# Patient Record
Sex: Female | Born: 1960 | Race: Black or African American | Hispanic: No | Marital: Married
Health system: Southern US, Community
[De-identification: ages and names within clinical notes are randomized; demographics above are authoritative.]

---

## 2003-08-30 ENCOUNTER — Other Ambulatory Visit: Payer: Self-pay

## 2003-11-23 ENCOUNTER — Other Ambulatory Visit: Payer: Self-pay

## 2004-01-04 ENCOUNTER — Other Ambulatory Visit: Payer: Self-pay

## 2004-01-04 ENCOUNTER — Emergency Department: Payer: Self-pay | Admitting: Emergency Medicine

## 2004-03-11 ENCOUNTER — Emergency Department (HOSPITAL_COMMUNITY): Admission: EM | Admit: 2004-03-11 | Discharge: 2004-03-11 | Payer: Self-pay | Admitting: Emergency Medicine

## 2004-07-02 ENCOUNTER — Emergency Department: Payer: Self-pay | Admitting: Emergency Medicine

## 2004-09-05 ENCOUNTER — Emergency Department: Payer: Self-pay | Admitting: Emergency Medicine

## 2004-09-05 ENCOUNTER — Other Ambulatory Visit: Payer: Self-pay

## 2004-11-08 ENCOUNTER — Other Ambulatory Visit: Payer: Self-pay

## 2004-11-08 ENCOUNTER — Emergency Department: Payer: Self-pay | Admitting: Emergency Medicine

## 2005-06-04 ENCOUNTER — Other Ambulatory Visit: Payer: Self-pay

## 2005-06-04 ENCOUNTER — Emergency Department: Payer: Self-pay | Admitting: Emergency Medicine

## 2005-08-21 ENCOUNTER — Other Ambulatory Visit: Payer: Self-pay

## 2005-08-21 ENCOUNTER — Emergency Department: Payer: Self-pay | Admitting: Unknown Physician Specialty

## 2005-10-27 ENCOUNTER — Emergency Department: Payer: Self-pay

## 2006-01-07 ENCOUNTER — Other Ambulatory Visit: Payer: Self-pay

## 2006-01-07 ENCOUNTER — Emergency Department: Payer: Self-pay | Admitting: Emergency Medicine

## 2006-02-01 ENCOUNTER — Ambulatory Visit: Payer: Self-pay | Admitting: Internal Medicine

## 2006-02-03 ENCOUNTER — Emergency Department: Payer: Self-pay | Admitting: Emergency Medicine

## 2006-02-03 ENCOUNTER — Other Ambulatory Visit: Payer: Self-pay

## 2007-09-18 ENCOUNTER — Other Ambulatory Visit: Payer: Self-pay

## 2007-09-18 ENCOUNTER — Emergency Department: Payer: Self-pay | Admitting: Emergency Medicine

## 2007-11-19 ENCOUNTER — Emergency Department: Payer: Self-pay | Admitting: Emergency Medicine

## 2007-11-20 ENCOUNTER — Other Ambulatory Visit: Payer: Self-pay

## 2007-11-21 ENCOUNTER — Other Ambulatory Visit: Payer: Self-pay

## 2007-11-21 ENCOUNTER — Emergency Department: Payer: Self-pay | Admitting: Emergency Medicine

## 2008-01-01 ENCOUNTER — Emergency Department: Payer: Self-pay | Admitting: Emergency Medicine

## 2008-03-14 ENCOUNTER — Emergency Department: Payer: Self-pay | Admitting: Emergency Medicine

## 2008-07-14 ENCOUNTER — Emergency Department: Payer: Self-pay | Admitting: Emergency Medicine

## 2008-08-30 ENCOUNTER — Emergency Department: Payer: Self-pay | Admitting: Emergency Medicine

## 2008-09-30 ENCOUNTER — Emergency Department: Payer: Self-pay | Admitting: Unknown Physician Specialty

## 2008-10-13 ENCOUNTER — Emergency Department: Payer: Self-pay | Admitting: Emergency Medicine

## 2008-12-16 ENCOUNTER — Emergency Department: Payer: Self-pay | Admitting: Emergency Medicine

## 2009-11-19 ENCOUNTER — Emergency Department: Payer: Self-pay | Admitting: Emergency Medicine

## 2010-08-14 ENCOUNTER — Emergency Department: Payer: Self-pay

## 2011-01-16 ENCOUNTER — Inpatient Hospital Stay: Payer: Self-pay | Admitting: Psychiatry

## 2011-02-03 ENCOUNTER — Observation Stay: Payer: Self-pay | Admitting: Internal Medicine

## 2011-04-08 IMAGING — CR DG CHEST 2V
1 series · 2 of 2 positions shown · non-contrast
Comparison: none

REASON FOR EXAM: chest pain left upper with shortness of breath
COMMENTS:   LMP: Post Hysterectomy

PROCEDURE:     DXR - DXR CHEST PA (OR AP) AND LATERAL  - August 31, 2008  [DATE]
RESULT:     Comparison is made to the prior exam of 11/21/2007. The lung
fields are clear. The heart, mediastinal and osseous structures reveal no
significant abnormalities.

[Series 1: view not recorded · 0.17mm/px · 2 of 2 slices shown]
[im 1/2]
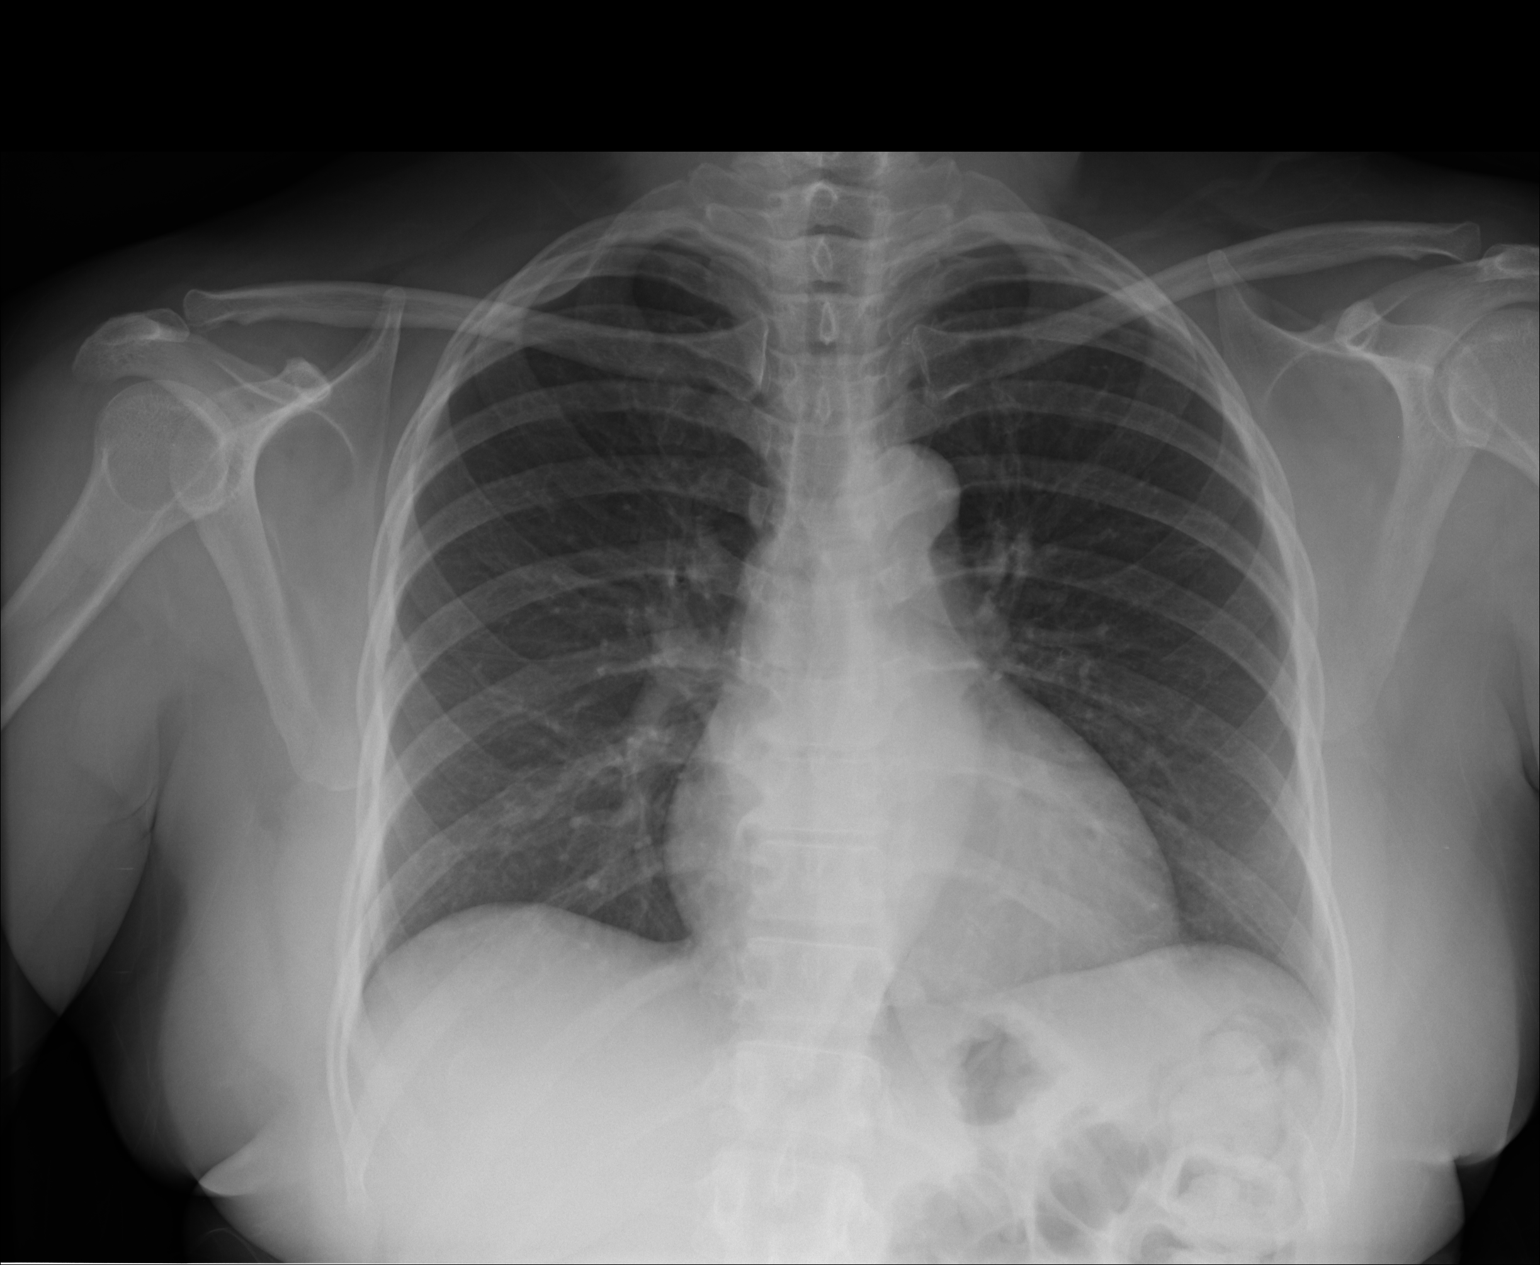
[im 2/2]
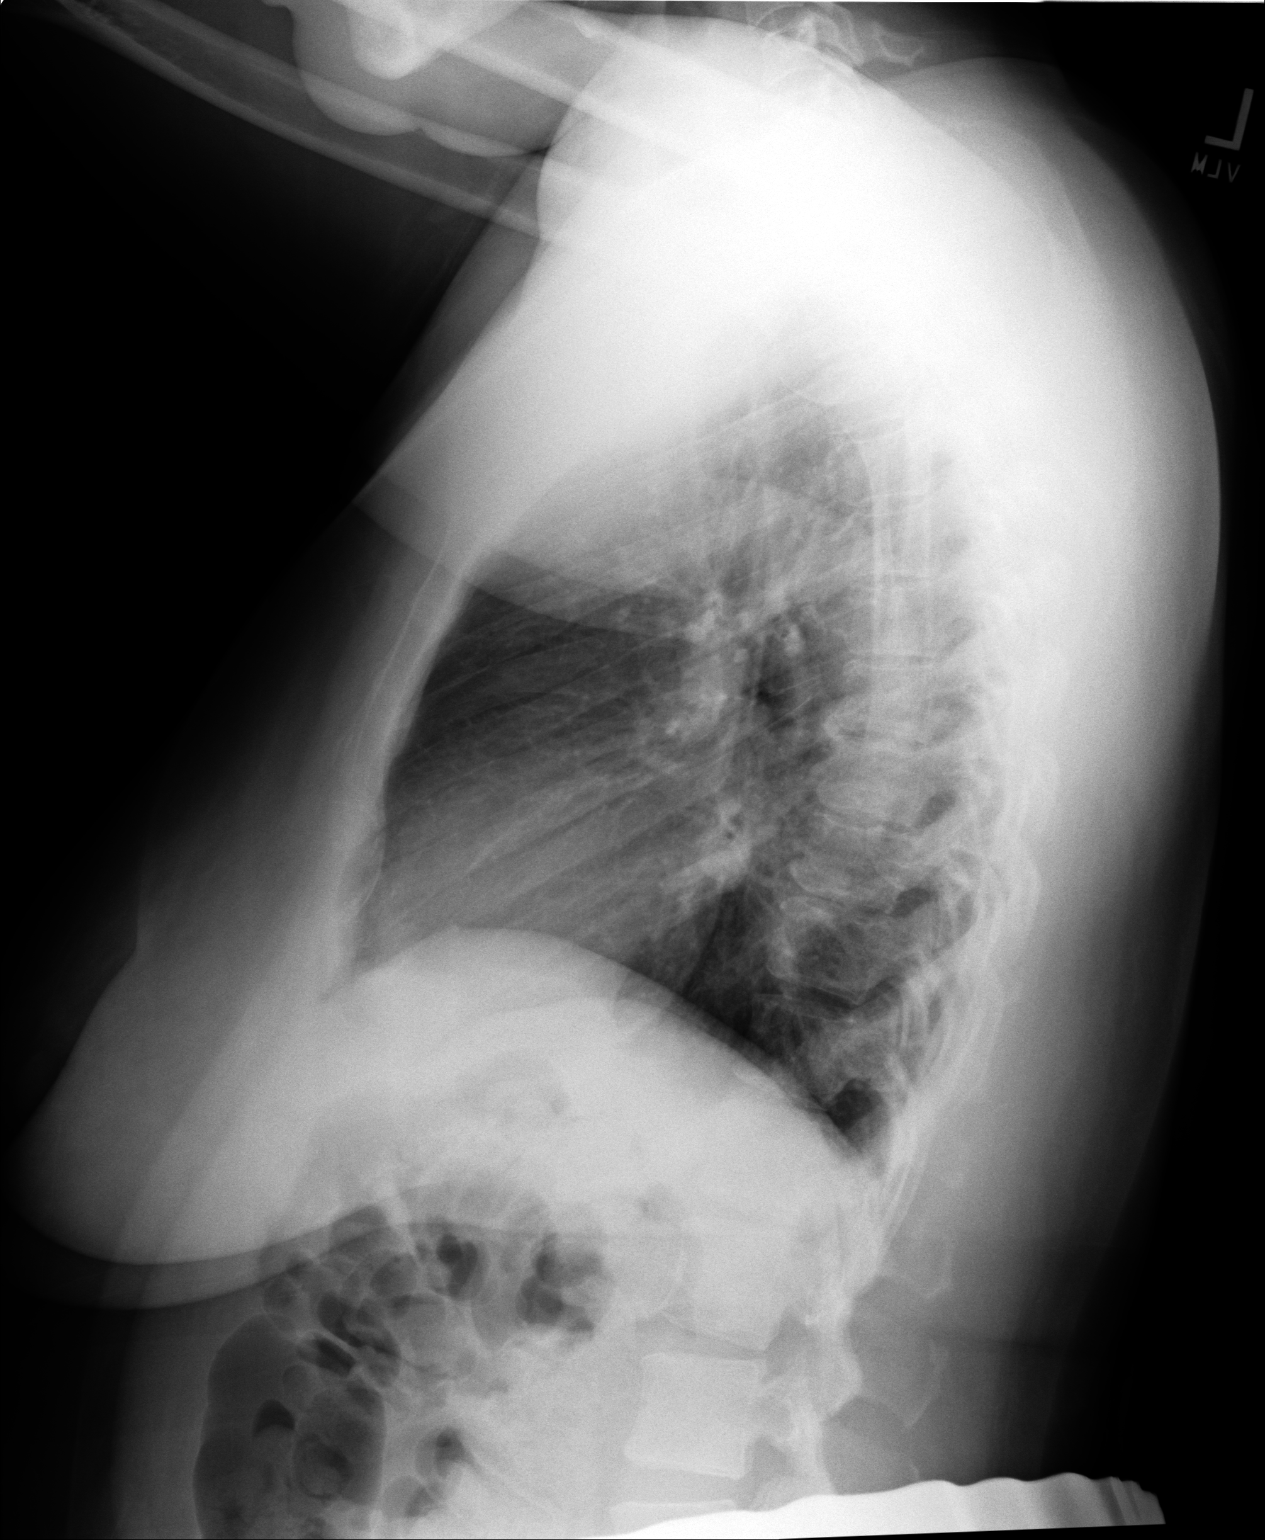

[2 of 2 positions shown; findings below may reference images not displayed]

IMPRESSION: 1.     No significant abnormalities are noted.

## 2011-05-17 ENCOUNTER — Inpatient Hospital Stay: Payer: Self-pay | Admitting: Psychiatry

## 2011-05-17 LAB — COMPREHENSIVE METABOLIC PANEL
Albumin: 3.7 g/dL (ref 3.4–5.0)
Alkaline Phosphatase: 44 U/L — ABNORMAL LOW (ref 50–136)
Bilirubin,Total: 0.2 mg/dL (ref 0.2–1.0)
Creatinine: 0.84 mg/dL (ref 0.60–1.30)
Osmolality: 282 (ref 275–301)
Potassium: 3.9 mmol/L (ref 3.5–5.1)
Sodium: 142 mmol/L (ref 136–145)
Total Protein: 8.3 g/dL — ABNORMAL HIGH (ref 6.4–8.2)

## 2011-05-17 LAB — CBC
HGB: 11.6 g/dL — ABNORMAL LOW (ref 12.0–16.0)
MCV: 71 fL — ABNORMAL LOW (ref 80–100)
Platelet: 264 10*3/uL (ref 150–440)
RBC: 5.29 10*6/uL — ABNORMAL HIGH (ref 3.80–5.20)
WBC: 6.5 10*3/uL (ref 3.6–11.0)

## 2011-05-17 LAB — DRUG SCREEN, URINE
Barbiturates, Ur Screen: NEGATIVE (ref ?–200)
Cannabinoid 50 Ng, Ur ~~LOC~~: POSITIVE (ref ?–50)
Cocaine Metabolite,Ur ~~LOC~~: NEGATIVE (ref ?–300)
MDMA (Ecstasy)Ur Screen: POSITIVE (ref ?–500)
Opiate, Ur Screen: NEGATIVE (ref ?–300)
Phencyclidine (PCP) Ur S: NEGATIVE (ref ?–25)

## 2011-05-17 LAB — ACETAMINOPHEN LEVEL: Acetaminophen: <2 ug/mL

## 2011-06-04 LAB — LIPID PANEL: HDL Cholesterol: 62 mg/dL — ABNORMAL HIGH (ref 40–60)

## 2012-07-29 ENCOUNTER — Ambulatory Visit: Payer: Self-pay | Admitting: Family Medicine

## 2013-03-21 IMAGING — CR DG CHEST 2V
1 series · 2 of 2 positions shown · non-contrast
Comparison: none

REASON FOR EXAM: cough
COMMENTS:

[Series 1: view not recorded · 0.17mm/px · 2 of 2 slices shown]
[im 1/2]
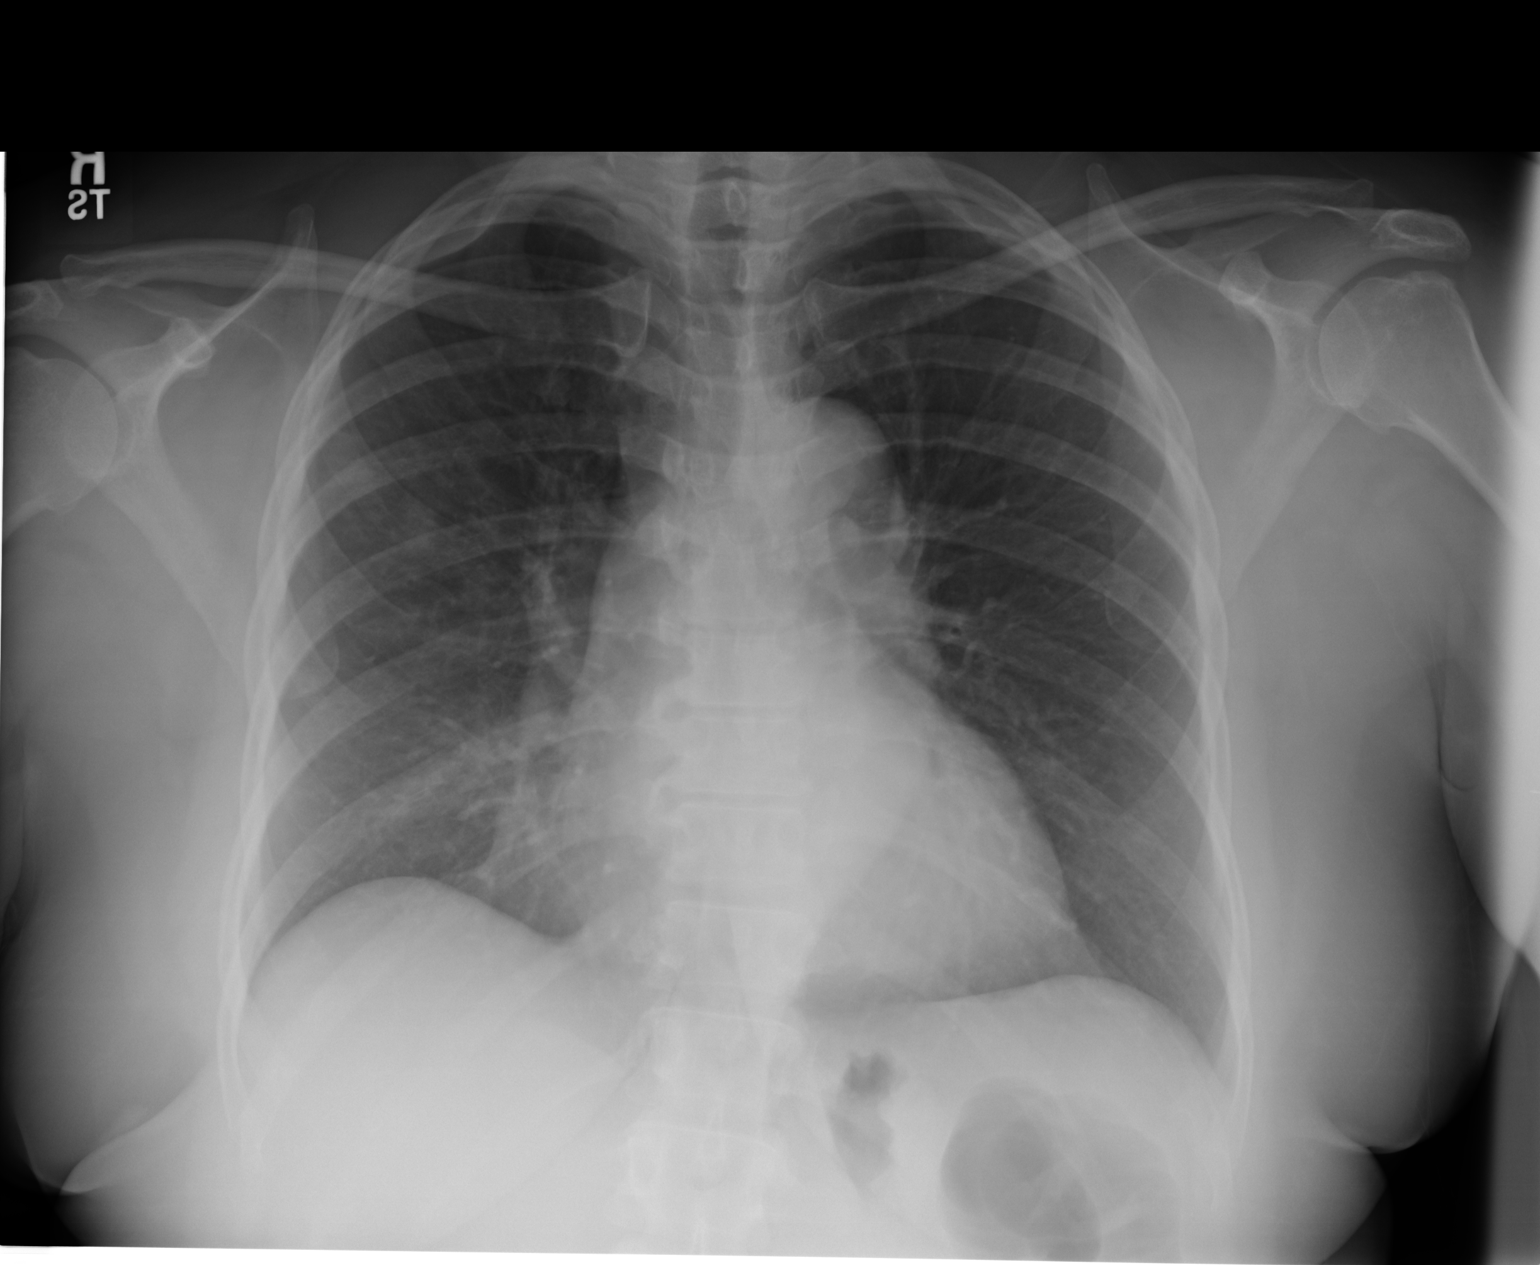
[im 2/2]
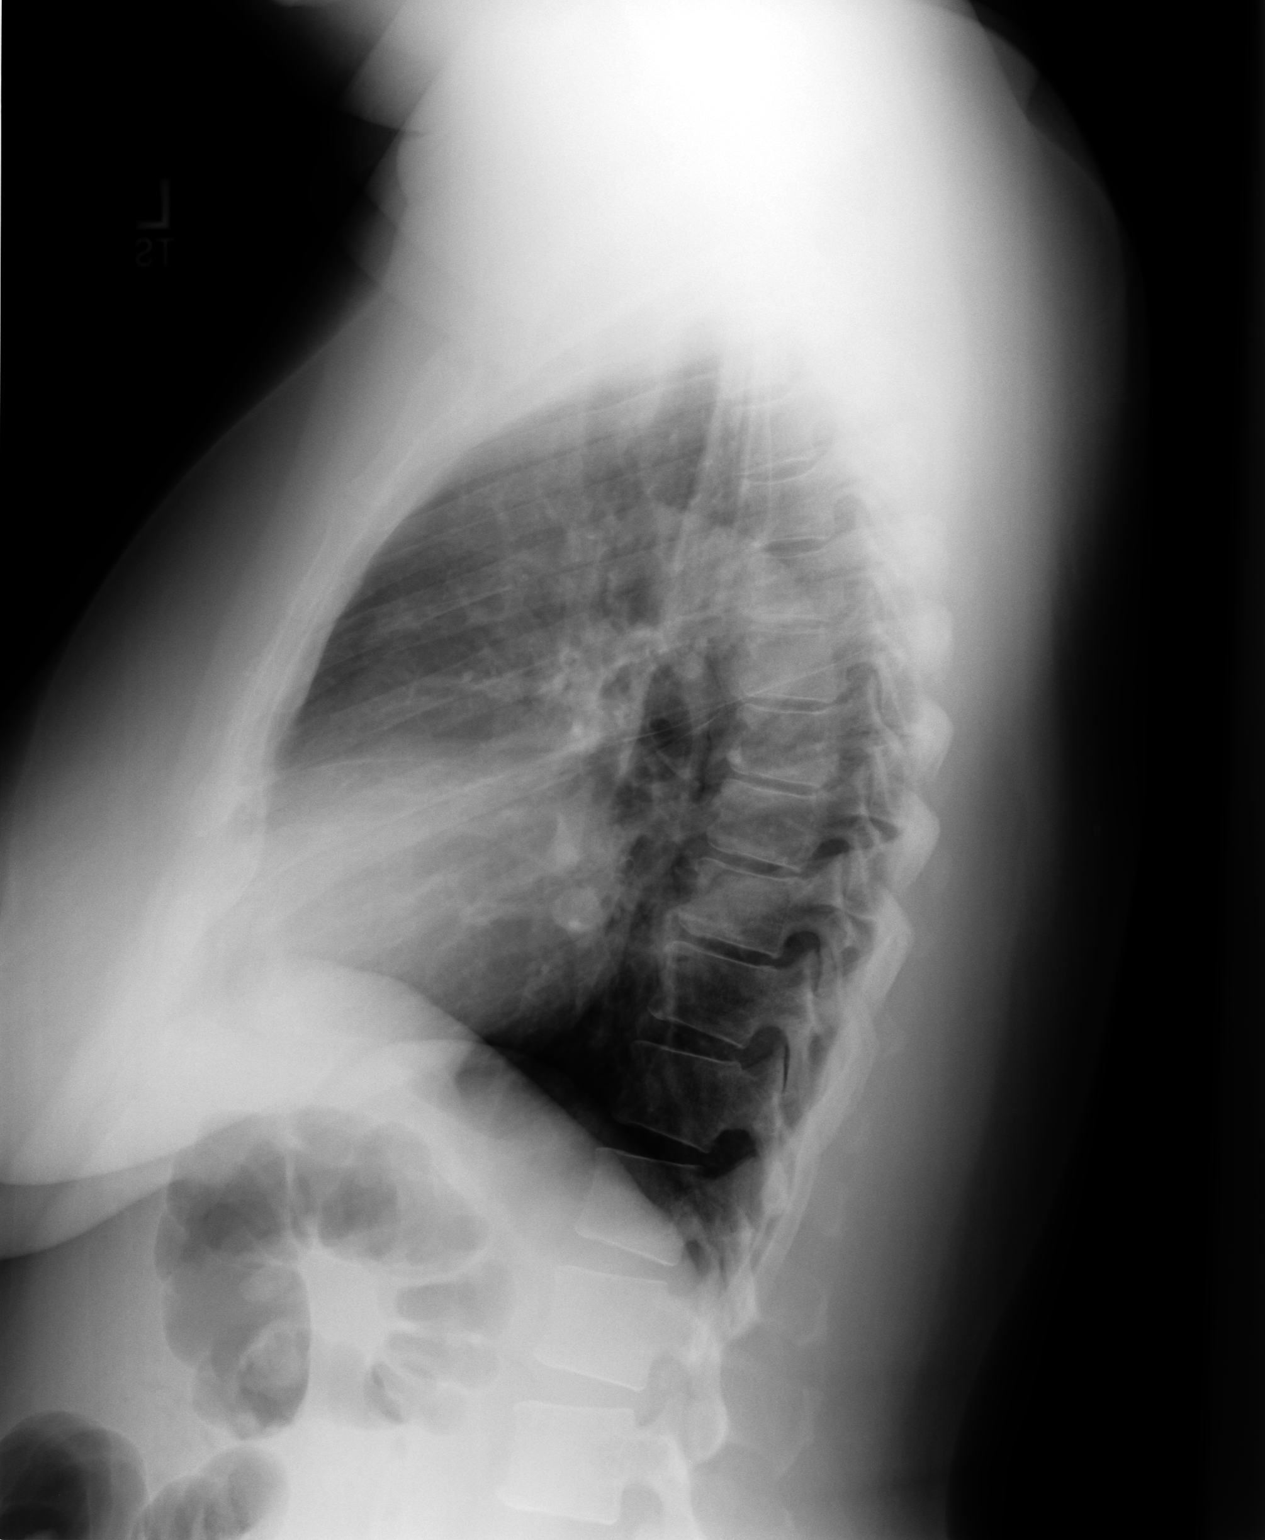

[2 of 2 positions shown; findings below may reference images not displayed]

PROCEDURE:     DXR - DXR CHEST PA (OR AP) AND LATERAL  - August 14, 2010  [DATE]

RESULT:

Comparison is made to a prior exam of 12/16/2008.

The current exam shows two faint areas of increased density projected over
the third, right anterior rib. The findings are likely secondary to the
confluence of vascular and osseous markings. Infiltrate is considered
unlikely but if symptomatology persists, follow-up examination is
recommended. The lung fields otherwise are clear. The heart, mediastinal and
osseous structures show no significant abnormalities.
IMPRESSION: 1.  There are minimal areas of increased density projected over the third,
right anterior rib and likely secondary to a confluence of vascular and
osseous markings. Nonetheless, if symptomatology persists, follow-up
examination is recommended to exclude progressive change in this region.
2.  The lung fields otherwise are clear.
3.  The heart size is normal.

## 2013-09-09 IMAGING — CT CT HEAD WITHOUT CONTRAST
2 series · 16 of 30 positions shown, 20 images · non-contrast
Comparison: none

REASON FOR EXAM: headache
COMMENTS:   May transport without cardiac monitor

PROCEDURE:     CT  - CT HEAD WITHOUT CONTRAST  - February 02, 2011  [DATE]
RESULT:     History: Headache.
Comparison Study: Head CT of 08/14/2010.

[Series 2: without · axial · non-contrast · 0.39mm/px · z∈[+392,+512]mm · 13 of 30 slices shown, 17 images]
[im 3/30  brain]
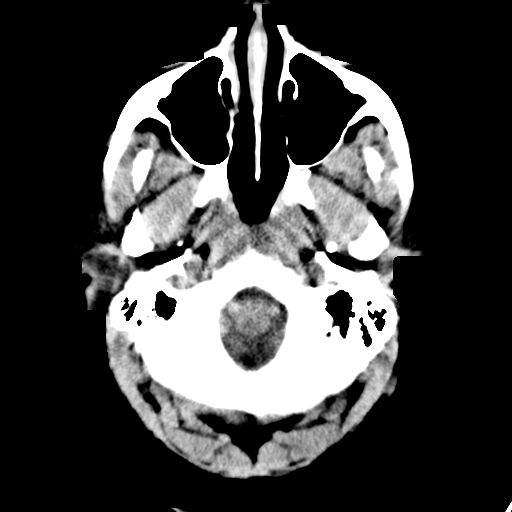
[im 3/30  bone]
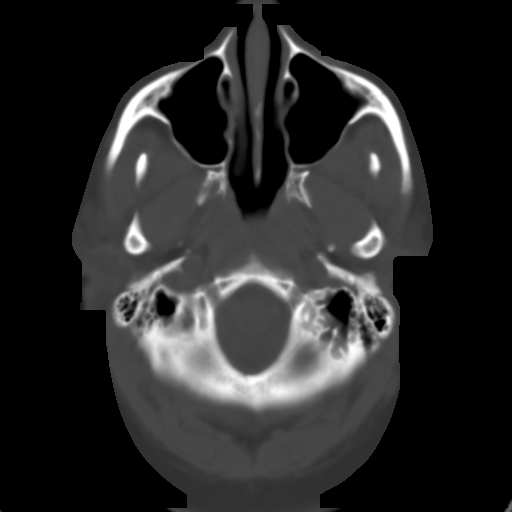
[im 5/30  brain]
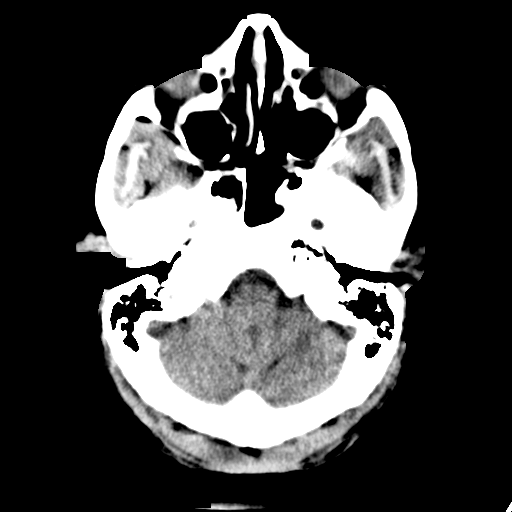
[im 7/30  brain]
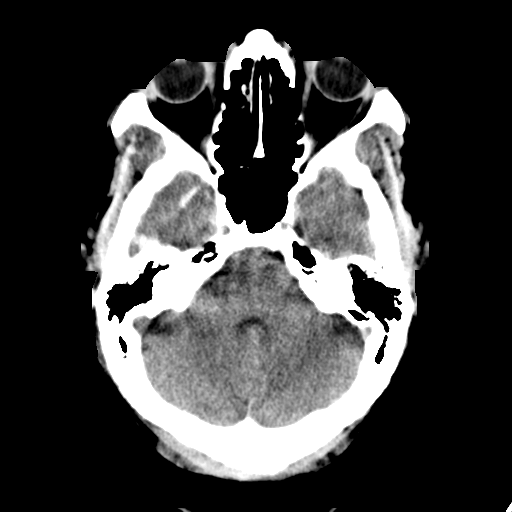
[im 9/30  brain]
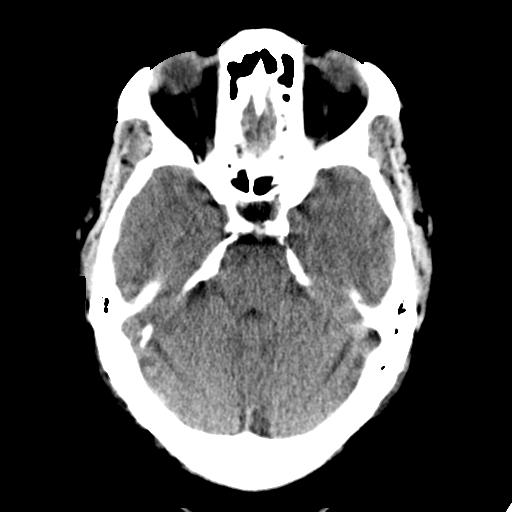
[im 11/30  brain]
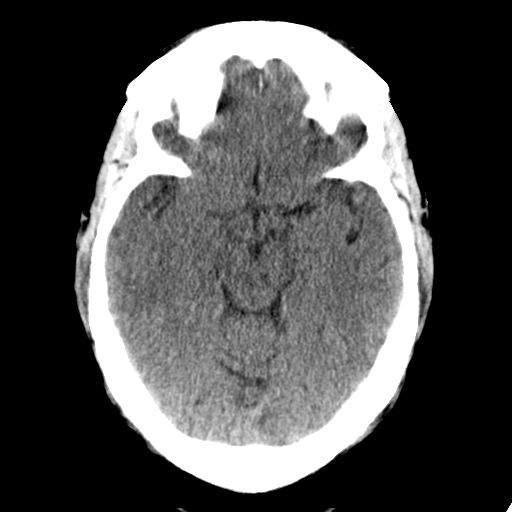
[im 11/30  bone]
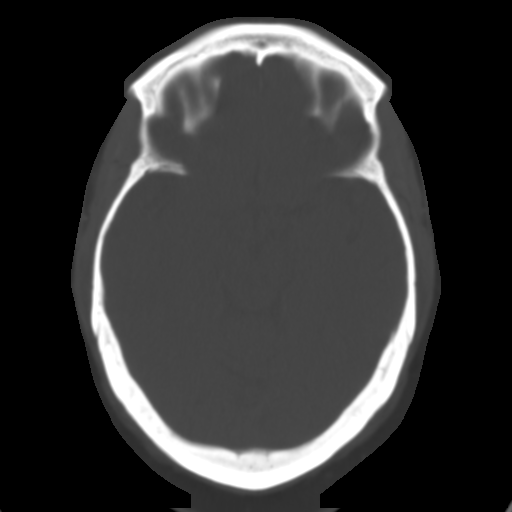
[im 13/30  brain]
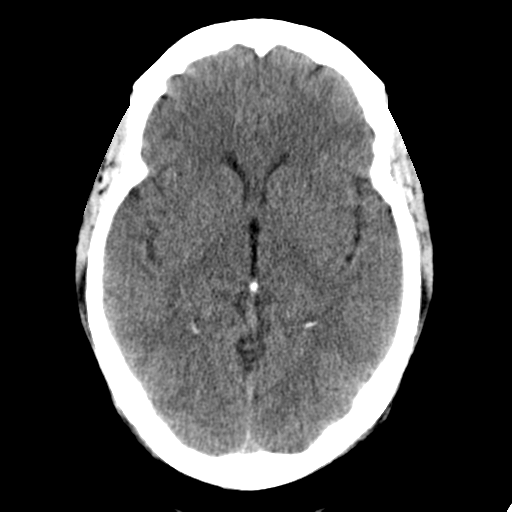
[im 15/30  brain]
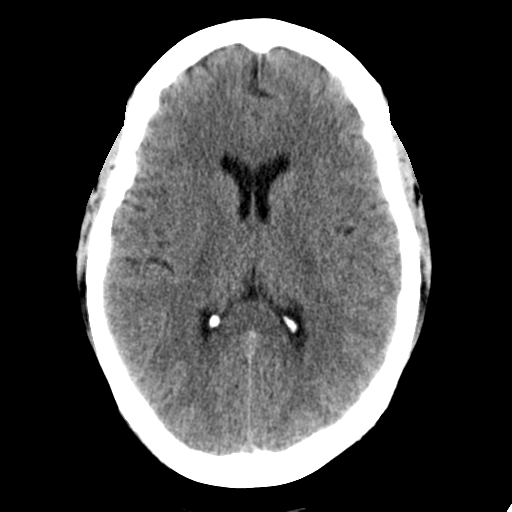
[im 17/30  brain]
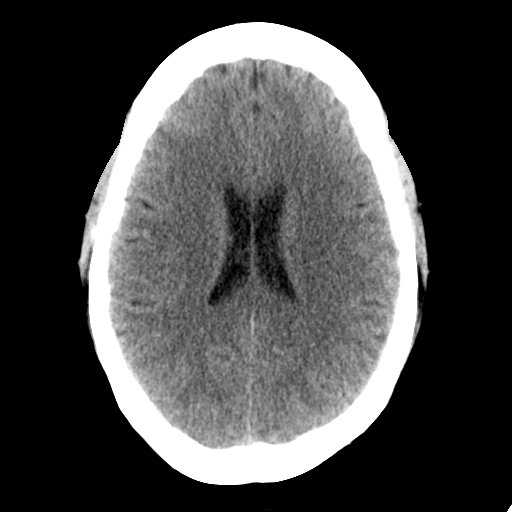
[im 19/30  brain]
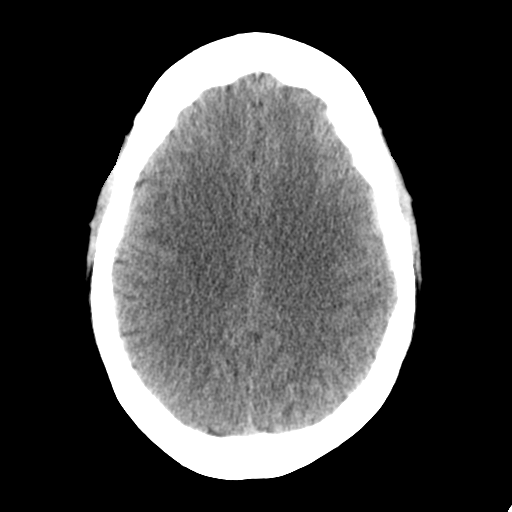
[im 19/30  bone]
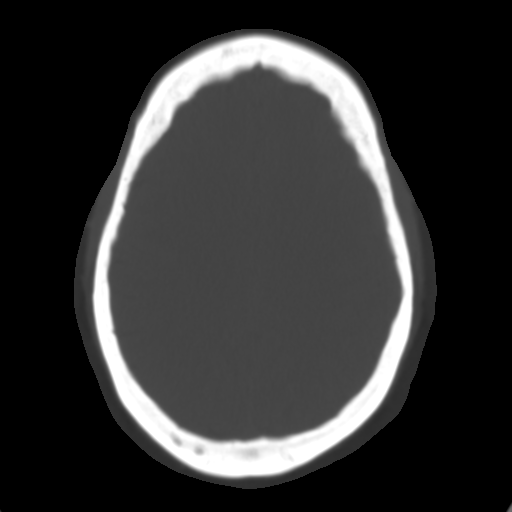
[im 21/30  brain]
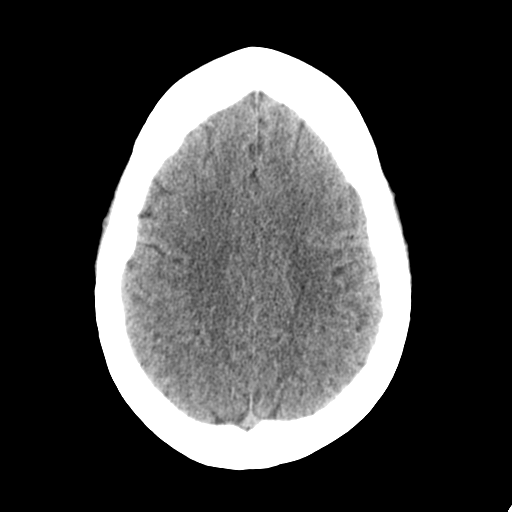
[im 23/30  brain]
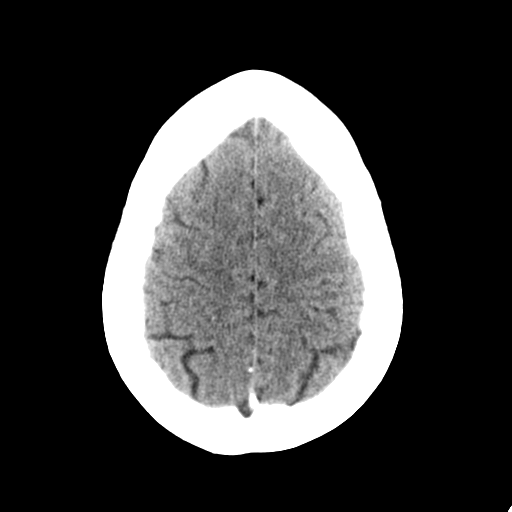
[im 25/30  brain]
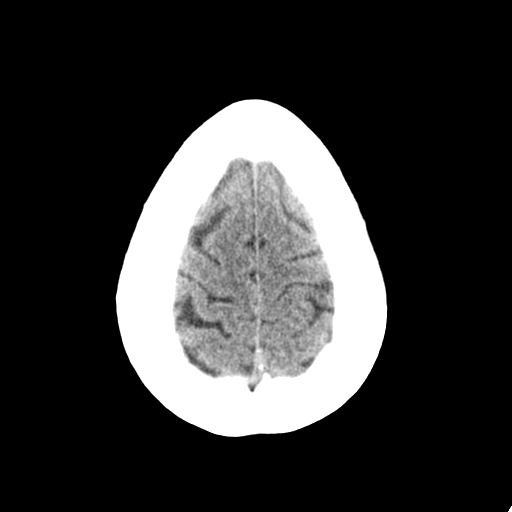
[im 27/30  brain]
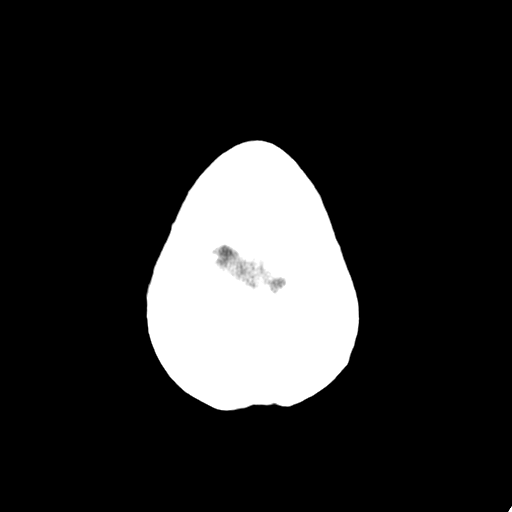
[im 27/30  bone]
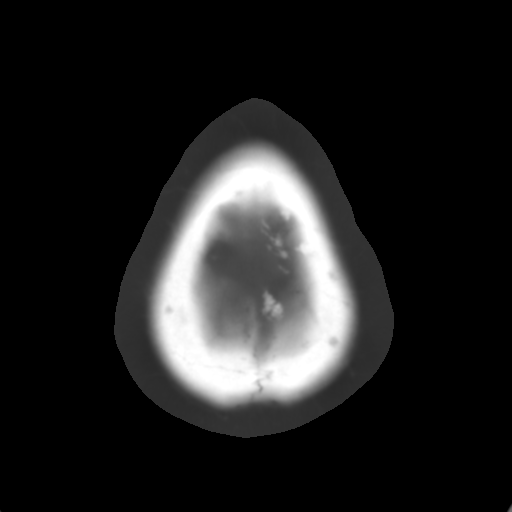

[Series 3: bone · axial · 0.39mm/px · z∈[+392,+432]mm · 3 of 30 slices shown]
[im 3/30  bone]
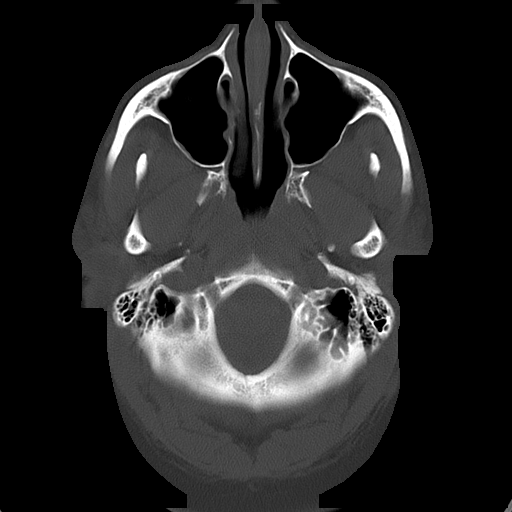
[im 7/30  bone]
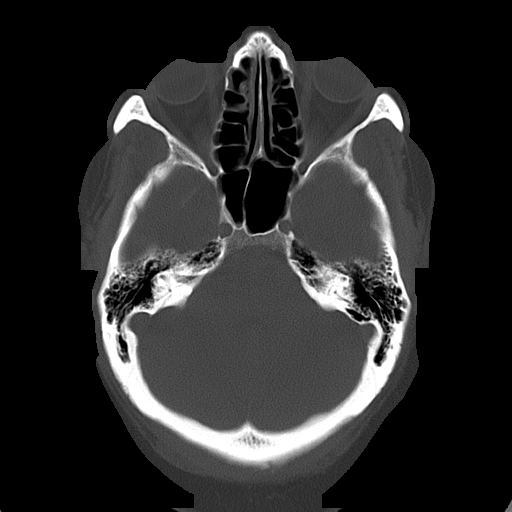
[im 11/30  bone]
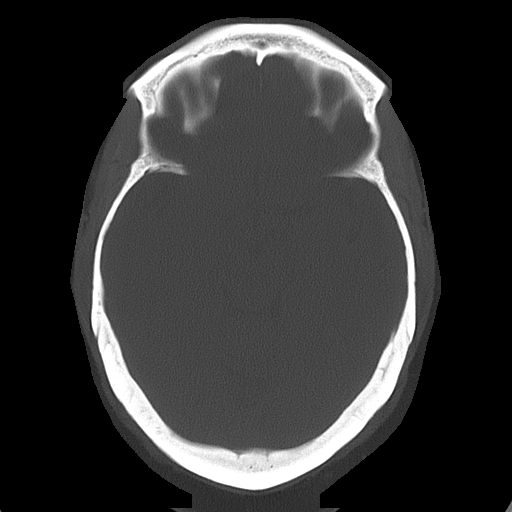

[16 of 30 positions shown; findings below may reference images not displayed]

FINDINGS: No mass. No hydrocephalus. No hemorrhage. No acute bony
abnormality.
IMPRESSION: No acute abnormality.

## 2014-04-21 ENCOUNTER — Ambulatory Visit: Payer: Self-pay

## 2014-06-20 NOTE — H&P (Signed)
PATIENT NAME:  Leah Cortez, Leah A MR#:  [id number] DATE OF BIRTH:  [birth year]  DATE OF ADMISSION:  05/17/2011  IDENTIFYING INFORMATION AND CHIEF COMPLAINT: This is a 54 year old woman with a past history of depression and psychotic symptoms who came to the Emergency Room.   CHIEF COMPLAINT: "I can't stand the voices."   HISTORY OF PRESENT ILLNESS: The patient states that she is having auditory hallucinations. She hears them most of the day. They often tell her that she should try to kill herself. They have been getting worse for weeks. Her mood stays depressed. She stays very tired, fatigued and withdrawn. She feels hopeless. She does not sleep well at night. Feels nonfunctional and like she cannot do anything during the day. Stays nervous a lot. She says that she has been compliant with her prescribed psychiatric medicine. She does not report any specific new stress or change in her life that would necessarily worsen psychiatric symptoms. She uses marijuana intermittently but not every day. She denies any other drug abuse. She has been compliant with her psychiatric medicine.   PAST PSYCHIATRIC HISTORY: The patient has had previous psychiatric hospitalizations most notably here about four months ago. She had similar symptoms at that time. Diagnosis was major depression with psychotic features. She improved on treatment with antidepressants and antipsychotics over the course of about two weeks. She tells me that after she got out of the hospital she did well for a little while, but then started to slide downhill even though she claims she has continued to be compliant with all of her medicines. She had been treated for similar kind of symptoms in the hospital a couple of years ago. This all seems to have been going on for several years. She says that she has been depressed for a long time, cannot quite remember how long. She does remember that she felt okay when she first got out of high school, but says that  it has been many years that she has been feeling this bad. She does not describe any past manic episodes that I can tell. She denies that she has actually tried to kill herself. She denies any history of violence.   PAST MEDICAL HISTORY: The patient has high blood pressure which has been resistant to o medication. She is currently on several medicines for blood pressure and still tends to run a high blood pressure. No other ongoing medical problems.   SOCIAL HISTORY: She does not get disability, is not able to work. She lives with a friend and also with her own 52 year old daughter. She relies on her 52 year old daughter to help her out a lot. She is not married. She does not do much outside the home. She does not stay active very much. 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   SUBSTANCE ABUSE HISTORY: She says that she uses marijuana intermittently but substance abuse does not seem to have ever been a major problem. She did not drink alcohol recently.   CURRENT MEDICATIONS:  1. Cymbalta 30 mg twice a day. 2. Trazodone 200 mg at night. 3. Geodon 60 mg twice a day. 4. Lisinopril 20 mg a day. 5. Clonidine 0.2 mg two or three times a day. 6. Amlodipine 10 mg a day.   ALLERGIES: Sulfa drugs and amoxicillin.   REVIEW OF SYSTEMS: Reports mood is depressed. Feels fatigued. Denies sweats or fever. Denies nausea. She has a low appetite  and has lost weight. She denies diarrhea or constipation. Poor sleep at night. Auditory hallucinations positive. Suicidal ideation has been positive. Homicidal ideation is negative.   MENTAL STATUS EXAMINATION: Carollee Sires in the Emergency Room. Cooperative but very slow. Very poor eye contact, barely looked at me even though I was sitting only a couple of feet away from her. Psychomotor activity very low and depressed. Speech very quiet often difficult to understand, also slow. Thought process slow and shows poverty  of thought. Endorses auditory hallucinations. She does not look like she is responding to internal stimuli. She denies visual hallucinations. Endorses recent suicidal ideation and feelings of hopelessness. She denies homicidal ideation. Judgment and insight impaired.   PHYSICAL EXAMINATION:   GENERAL: The patient is an overweight woman, although I do note that she weighs less than she has on previous admissions. She does not appear to be in any obvious physical distress.   CURRENT VITAL SIGNS: Temperature 97, pulse 77, respirations 18, blood pressure 154/94.   SKIN: No skin lesions identified.   HEENT: Pupils equal and reactive. Face symmetric.   NEUROLOGICAL: Cranial nerves all intact. Strength and reflex is normal throughout. Gait within normal limits.   LUNGS: Clear to auscultation.   HEART: Regular rate and rhythm.   ABDOMEN: Soft and nontender. Normal strength, reflexes, and sensation.   RESULTS OF LAB TESTS: Lab tests done in the Emergency Room show a thyroid stimulating hormone that is normal. She does have a low hemoglobin at 11.6 with some low indices, low MCV at 71. White blood cell count is normal at 6.5. Alcohol undetectable. Chemistry panel shows slightly low alkaline phosphatase; I doubt that is of clear significance. Drug screen positive for marijuana and also for MDMA.   ASSESSMENT: A 54 year old woman with a history of depression and psychosis reporting suicidal ideation, auditory hallucinations, and inability to take care of herself. She says she has been compliant with her medicines. She says symptoms have been progressively getting worse for quite some time. She does have a drug screen positive for cannabis and MDMA, but she denies that she has been heavily abusing drugs. She has a past history of similar symptoms that have responded to medication and supportive therapy in the past. She needs hospitalization because of acute dangerousness.   TREATMENT PLAN: Admit to the  psychiatric ward. Continue on all current medications. Monitor vital signs. Engage the patient in groups and activities. Supportive therapy and cognitive therapy daily. Monitor progress in resolving auditory hallucinations as well as suicidal ideation. Consider alternative treatments if necessary.   DIAGNOSIS PRINCIPLE AND PRIMARY:   AXIS I: Major depressive episode, recurrent, with psychotic features.   SECONDARY DIAGNOSES:   AXIS I: Cannabis use, rule out abuse.   AXIS II: Deferred.   AXIS III: Hypertension, seasonal allergies.   AXIS IV: Severe due to stress from lack of resources.   AXIS V: Functioning at time of admission 20.  ____________________________ Audery Amel, MD jtc:slb D: 05/17/2011 17:10:45 ET T: 05/17/2011 17:45:55 ET JOB#: [id number]  cc: Audery Amel, MD, <Dictator> Audery Amel MD ELECTRONICALLY SIGNED 05/18/2011 17:51

## 2014-06-20 NOTE — Discharge Summary (Signed)
PATIENT NAME:  Leah Cortez, Leah Cortez MR#:  [id number] DATE OF BIRTH:  06-03-1960  DATE OF ADMISSION:  05/17/2011 DATE OF DISCHARGE:  06/04/2011  HOSPITAL COURSE: The patient was admitted to the hospital with psychotic symptoms, particularly auditory hallucinations and also suicidal ideation with severe depression. She has a history of recurrent episodes of this type, not clear if it is better diagnosed as recurrent severe major depression with psychosis or as schizoaffective disorder. In the hospital she remained quite sick for much of her hospital stay. She endorsed auditory hallucinations. She did not engage in any dangerous or aggressive behavior, did not make any suicide attempts but for several days still had some suicidal ideation. Gradually with medication her symptoms improved. She still remained somewhat withdrawn with a blank affect much of the time and looked kind of depressed. I added Abilify 5 mg to her medicine as a booster treatment for depression understanding this is making two atypical antipsychotics. This can be looked at as failure of monotherapy to fully control her symptoms despite several antipsychotics in the past or potentially as a cross taper as the outpatient physician may prefer. Once started on the Abilify, she seemed to have improved even more whether it's cause or effect or not. Her mood has improved. She's smiling more. She is more interactive. She takes better care of her hygiene. She now denies any suicidal ideation, says she is not hearing voices at all. Smiles more. Has decided to go to live with her sister at the time of discharge because it is a less stressful environment. Shows good insight about her illness and agrees to follow-up treatment in the community. The patient has been counseled about use of marijuana and its negative impact on mental illness and strongly encouraged to discontinue it. She agrees to this.  LABORATORY DATA: On admission TSH normal at 1.99. Alcohol  undetectable. Total protein elevated at 8.3, alkaline phosphatase slightly low at 44. No other chemistry abnormalities. Drug screen positive for cannabis and MDMA. Hematology panel showed slightly low hemoglobin at 11.6, slightly low MCV at 71. A lipid panel checked prior to discharge shows cholesterol 188, triglycerides 67, HDL 62 which is elevated, VLDL 13, and LDL 113 which is just a little bit elevated.   DISCHARGE MEDICATIONS:  1. Amlodipine 10 mg per day.  2. Zyrtec 10 mg per day.  3. Clonidine patch TTS-2 once per week.  4. Lisinopril 20 mg per day.  5. Cymbalta 90 mg per day.  6. MiraLAX 17 grams per day.  7. Ambien 10 mg at night.  8. Trandate (labetalol) 100 mg per day.  9. Docusate 200 mg twice a day.  10. Risperdal 5 mg twice a day.  11. Abilify 5 mg per day.   MENTAL STATUS EXAMINATION: Neatly dressed and groomed woman. Good eye contact. Speech is decreased in total amount but easy to understand and better than on admission for sure. Psychomotor activity still a little bit slowed. No EPS to direct examination. Affect a little more reactive, smiling more, a little more upbeat. Mood stated as good. Thoughts generally lucid although slow still. Denies delusions. Denies hallucinations. Denies suicidal or homicidal ideation. Shows some improved judgment and insight.   DISPOSITION:  1. Discharge back to her sister's house.  2. Follow-up with psychiatric treatment in the community as previously.   DIAGNOSES PRINCIPLE AND PRIMARY:  AXIS I: Major depression, severe, recurrent, with psychotic features.   SECONDARY DIAGNOSES:  AXIS I: Rule out schizoaffective disorder.   AXIS  II: No diagnosis.   AXIS III:  1. Hypertension. 2. Constipation.   AXIS IV: Moderate to severe from chronic burden of illness and chaotic social situation.   AXIS V: Functioning at time of discharge 55.   ____________________________ Audery AmelJohn T. Clapacs, MD jtc:drc D: 06/04/2011 11:52:43 ET T: 06/05/2011  10:05:31 ET JOB#: [id number] Audery AmelJOHN T CLAPACS MD ELECTRONICALLY SIGNED 06/05/2011 23:02
